# Patient Record
Sex: Male | Born: 1995 | Hispanic: No | Marital: Single | Smoking: Never smoker
Health system: Southern US, Community
[De-identification: ages and names within clinical notes are randomized; demographics above are authoritative.]

## PROBLEM LIST (undated history)

## (undated) ENCOUNTER — Emergency Department (HOSPITAL_BASED_OUTPATIENT_CLINIC_OR_DEPARTMENT_OTHER): Admission: EM | Payer: Medicaid Other | Source: Home / Self Care

## (undated) HISTORY — PX: FOOT SURGERY: SHX648

---
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
## 2018-12-25 ENCOUNTER — Ambulatory Visit (HOSPITAL_COMMUNITY)
Admission: EM | Admit: 2018-12-25 | Discharge: 2018-12-25 | Disposition: A | Discharge status: Home / Self Care | Payer: 59 | Attending: Family Medicine | Admitting: Family Medicine

## 2018-12-25 ENCOUNTER — Encounter (HOSPITAL_COMMUNITY): Payer: Self-pay

## 2018-12-25 ENCOUNTER — Other Ambulatory Visit: Payer: Self-pay

## 2018-12-25 DIAGNOSIS — S39012A Strain of muscle, fascia and tendon of lower back, initial encounter: Secondary | ICD-10-CM | POA: Diagnosis not present

## 2018-12-25 DIAGNOSIS — Y9366 Activity, soccer: Secondary | ICD-10-CM

## 2018-12-25 LAB — POCT URINALYSIS DIP (DEVICE)
Bilirubin Urine: NEGATIVE
Glucose, UA: NEGATIVE mg/dL
Hgb urine dipstick: NEGATIVE
Ketones, ur: NEGATIVE mg/dL
Leukocytes,Ua: NEGATIVE
Nitrite: NEGATIVE
Protein, ur: NEGATIVE mg/dL
Specific Gravity, Urine: >=1.03 (ref 1.005–1.030)
Urobilinogen, UA: 0.2 mg/dL (ref 0.0–1.0)
pH: 6.5 (ref 5.0–8.0)

## 2018-12-25 MED ORDER — CYCLOBENZAPRINE HCL 5 MG PO TABS: 5.0000 mg | ORAL_TABLET | Freq: Three times a day (TID) | ORAL | 0 refills | Status: AC | PRN

## 2018-12-25 MED ORDER — MELOXICAM 7.5 MG PO TABS: 7.5000 mg | ORAL_TABLET | Freq: Every day | ORAL | 0 refills | Status: DC

## 2018-12-25 NOTE — ED Provider Notes (Signed)
[facility]    CSN: [id number] Arrival date & time: 12/25/18  1557      History   Chief Complaint Chief Complaint  Patient presents with   Back Pain    HPI Jeffrey May is a 23 y.o. male.   Jeffrey May presents with complaints of left low back pain which started 11/1 while he was playing in a soccer game. He had jumped in the air to head the soccer ball, quickly rotating his head/neck. With that motion he felt pain to his left low back. He did not fall or land on his back, he landed back onto his feet. He has had pain since. Certain movements worsen the pain, especially bending. He does have some pain even with sitting, however. Pain is sharp. Has improved mildly but not much, pain 7/ 10. Doesn't radiate to buttocks or thighs. Has taken ibuprofen and aleve which have helped some. No leg weakness. No numbness or tingling. No saddle symptoms. No bladder or bowel incontinence. He also works for [employer] with frequent bending and lifting, he had to miss work today due to pain. He plays a lot of soccer with an upcoming tournament he hopes to play in. Has had some back pain in the past but feels like this is lasting longer than typical for him.     ROS per HPI, negative if not otherwise mentioned.      History reviewed. No pertinent past medical history.  There are no active problems to display for this patient.   History reviewed. No pertinent surgical history.     Home Medications    Prior to Admission medications   Medication Sig Start Date End Date Taking? Authorizing Provider  ibuprofen (MOTRIN IB) 200 MG tablet Take 2 tablets (400 mg total) by mouth every 6 (six) hours as needed for pain. 05/13/12  Yes Joelyn Oms, MD  cyclobenzaprine (FLEXERIL) 5 MG tablet Take 1 tablet (5 mg total) by mouth 3 (three) times daily as needed for muscle spasms. 12/25/18   Georgetta Haber, NP  meloxicam (MOBIC) 7.5 MG tablet Take 1 tablet (7.5 mg total) by  mouth daily. 12/25/18   Georgetta Haber, NP    Family History Family History  Problem Relation Age of Onset   Hypertension Mother    Healthy Father     Social History Social History   Tobacco Use   Smoking status: Never Smoker   Smokeless tobacco: Never Used  Substance Use Topics   Alcohol use: No   Drug use: No     Allergies   Patient has no known allergies.   Review of Systems Review of Systems   Physical Exam Triage Vital Signs ED Triage Vitals  Enc Vitals Group     BP 12/25/18 1635 129/88     Pulse Rate 12/25/18 1635 62     Resp 12/25/18 1635 17     Temp 12/25/18 1635 97.8 F (36.6 C)     Temp Source 12/25/18 1635 Oral     SpO2 12/25/18 1635 100 %     Weight --      Height --      Head Circumference --      Peak Flow --      Pain Score 12/25/18 1634 7     Pain Loc --      Pain Edu? --      Excl. in GC? --    No data found.  Updated Vital Signs BP 129/88 (BP  Location: Left Arm)    Pulse 62    Temp 97.8 F (36.6 C) (Oral)    Resp 17    SpO2 100%    Physical Exam Constitutional:      Appearance: He is well-developed.  Cardiovascular:     Rate and Rhythm: Normal rate.  Pulmonary:     Effort: Pulmonary effort is normal.  Musculoskeletal:     Lumbar back: He exhibits pain. He exhibits normal range of motion, no tenderness, no bony tenderness, no swelling, no edema, no deformity, no laceration, no spasm and normal pulse.       Back:     Comments: Indicates pain to left low back without pain on palpation; strength equal bilaterally; gross sensation intact to lower extremities; negative straight leg raise; ambulatory without difficulty; full ROm of back   Skin:    General: Skin is warm and dry.  Neurological:     Mental Status: He is alert and oriented to person, place, and time.      UC Treatments / Results  Labs (all labs ordered are listed, but only abnormal results are displayed) Labs Reviewed  POCT URINALYSIS DIP (DEVICE)     EKG   Radiology No results found.  Procedures Procedures (including critical care time)  Medications Ordered in UC Medications - No data to display  Initial Impression / Assessment and Plan / UC Course  I have reviewed the triage vital signs and the nursing notes.  Pertinent labs & imaging results that were available during my care of the patient were reviewed by me and considered in my medical decision making (see chart for details).     Low back strain without acute red flag findings. Pain management and prevention discussed. Patient plays a lot of soccer regularly, I do recommend following up with sports medicine for more long term management. Return precautions provided. Patient verbalized understanding and agreeable to plan.  Ambulatory out of clinic without difficulty.    Final Clinical Impressions(s) / UC Diagnoses   Final diagnoses:  Strain of lumbar region, initial encounter     Discharge Instructions     Light and regular activity as tolerated.  See exercises provided.  Heat application while active can help with muscle spasms.  Sleep with pillow under your knees.   Meloxicam daily. Don't take additional ibuprofen for aleve. Take with food.  Flexeril as needed, up to three times a day, this may be most helpful before bed. May cause drowsiness. Please do not take if driving or drinking alcohol.   Careful with lifting, and use appropriate techniques.  Please follow up with sports medicine for persistent or recurrent symptoms.    ED Prescriptions    Medication Sig Dispense Auth. Provider   meloxicam (MOBIC) 7.5 MG tablet Take 1 tablet (7.5 mg total) by mouth daily. 20 tablet Augusto Gamble B, NP   cyclobenzaprine (FLEXERIL) 5 MG tablet Take 1 tablet (5 mg total) by mouth 3 (three) times daily as needed for muscle spasms. 15 tablet Zigmund Gottron, NP     PDMP not reviewed this encounter.   Zigmund Gottron, NP 12/25/18 [phone]

## 2018-12-25 NOTE — ED Triage Notes (Signed)
Patient presents to Urgent Care with complaints of left lower back pain since 6 days ago during a soccer game. Patient reports "I was going up for a header and missed the ball and when I landed I felt a sharp pain on my left lower back and couldn't keep running afterwards".

## 2018-12-25 NOTE — Discharge Instructions (Signed)
Light and regular activity as tolerated.  See exercises provided.  Heat application while active can help with muscle spasms.  Sleep with pillow under your knees.   Meloxicam daily. Don't take additional ibuprofen for aleve. Take with food.  Flexeril as needed, up to three times a day, this may be most helpful before bed. May cause drowsiness. Please do not take if driving or drinking alcohol.   Careful with lifting, and use appropriate techniques.  Please follow up with sports medicine for persistent or recurrent symptoms.

## 2019-11-06 ENCOUNTER — Other Ambulatory Visit: Payer: Self-pay

## 2019-11-06 ENCOUNTER — Ambulatory Visit (HOSPITAL_COMMUNITY)
Admission: EM | Admit: 2019-11-06 | Discharge: 2019-11-06 | Disposition: A | Discharge status: Home / Self Care | Payer: 59 | Attending: Family Medicine | Admitting: Family Medicine

## 2019-11-06 ENCOUNTER — Encounter (HOSPITAL_COMMUNITY): Payer: Self-pay | Admitting: Emergency Medicine

## 2019-11-06 ENCOUNTER — Ambulatory Visit (INDEPENDENT_AMBULATORY_CARE_PROVIDER_SITE_OTHER): Payer: 59

## 2019-11-06 DIAGNOSIS — M25461 Effusion, right knee: Secondary | ICD-10-CM

## 2019-11-06 DIAGNOSIS — S8991XA Unspecified injury of right lower leg, initial encounter: Secondary | ICD-10-CM

## 2019-11-06 DIAGNOSIS — M25561 Pain in right knee: Secondary | ICD-10-CM | POA: Diagnosis not present

## 2019-11-06 MED ORDER — PREDNISONE 50 MG PO TABS
50.0000 mg | ORAL_TABLET | Freq: Every day | ORAL | 0 refills | Status: AC
Start: 2019-11-06 — End: 2019-11-11

## 2019-11-06 NOTE — Discharge Instructions (Addendum)
Your x-ray was negative for any fracture however was significant for right knee effusion which is fluid on the knee.  A continue wrapping your knee and application of ice at least twice daily.  Start prednisone as prescribed.  Try for at least 4 to 5 days if no significant improvement of pain follow-up with [name] directly across the street from our office.  I have included their contact information here on your discharge summary.

## 2019-11-06 NOTE — ED Provider Notes (Signed)
[facility]    CSN: [id number] Arrival date & time: 11/06/19  1457      History   Chief Complaint Chief Complaint  Patient presents with  . Knee Pain    APPT    HPI Jeffrey May is a 24 y.o. male.   HPI  Patient presents for evaluation of right knee pain following an injury while playing soccer 3 weeks ago. The injury was healing appropriately and he thinks he re injured it a few days ago. Pain with bearing weight on knee. No swelling, however with lots of walking, the knee pain increases. He thinks he twist the knee initially, however, recently felt a pop and pain lateral right knee while running. History reviewed. No pertinent past medical history.  There are no problems to display for this patient.   History reviewed. No pertinent surgical history.     Home Medications    Prior to Admission medications   Medication Sig Start Date End Date Taking? Authorizing Provider  cyclobenzaprine (FLEXERIL) 5 MG tablet Take 1 tablet (5 mg total) by mouth 3 (three) times daily as needed for muscle spasms. 12/25/18   Georgetta Haber, NP  ibuprofen (MOTRIN IB) 200 MG tablet Take 2 tablets (400 mg total) by mouth every 6 (six) hours as needed for pain. 05/13/12   Joelyn Oms, MD  meloxicam (MOBIC) 7.5 MG tablet Take 1 tablet (7.5 mg total) by mouth daily. 12/25/18   Georgetta Haber, NP    Family History Family History  Problem Relation Age of Onset  . Hypertension Mother   . Healthy Father     Social History Social History   Tobacco Use  . Smoking status: Never Smoker  . Smokeless tobacco: Never Used  Vaping Use  . Vaping Use: Never used  Substance Use Topics  . Alcohol use: No  . Drug use: No     Allergies   Patient has no known allergies.  Review of Systems Review of Systems Pertinent negatives listed in HPI  Physical Exam Triage Vital Signs ED Triage Vitals  Enc Vitals Group     BP 11/06/19 1601 106/90     Pulse Rate 11/06/19 1601 (!)  56     Resp 11/06/19 1601 18     Temp 11/06/19 1601 97.6 F (36.4 C)     Temp Source 11/06/19 1601 Oral     SpO2 11/06/19 1601 100 %     Weight --      Height --      Head Circumference --      Peak Flow --      Pain Score 11/06/19 1600 6     Pain Loc --      Pain Edu? --      Excl. in GC? --    No data found.  Updated Vital Signs BP 106/90 (BP Location: Left Arm)   Pulse (!) 56   Temp 97.6 F (36.4 C) (Oral)   Resp 18   SpO2 100%   Visual Acuity Right Eye Distance:   Left Eye Distance:   Bilateral Distance:    Right Eye Near:   Left Eye Near:    Bilateral Near:     Physical Exam Constitutional:      Appearance: Normal appearance.  Musculoskeletal:     Cervical back: Normal range of motion and neck supple.     Right knee: No swelling or bony tenderness. Decreased range of motion. No tenderness.     Left knee:  Normal. No swelling or bony tenderness. Normal range of motion. Normal meniscus and normal patellar mobility.  Skin:    General: Skin is warm and dry.  Neurological:     Mental Status: He is alert.      UC Treatments / Results  Labs (all labs ordered are listed, but only abnormal results are displayed) Labs Reviewed - No data to display  EKG   Radiology No results found.  Procedures Procedures (including critical care time)  Medications Ordered in UC Medications - No data to display  Initial Impression / Assessment and Plan / UC Course  I have reviewed the triage vital signs and the nursing notes.  Pertinent labs & imaging results that were available during my care of the patient were reviewed by me and considered in my medical decision making (see chart for details).      Treating for acute knee pain likely related to inflammation.  See medication discharge orders.  Encouraged RICE and wrap with an Ace wrap.  Trial of ice at least twice daily over the next 5 to 7 days.  Avoid any aggressive activity as this can reinjure her knee.  If no  improvement recommend follow-up at [facility] on [address]  Patient verbalized understanding and agreement with plan. Final Clinical Impressions(s) / UC Diagnoses   Final diagnoses:  Acute pain of right knee   Discharge Instructions   None    ED Prescriptions    None     PDMP not reviewed this encounter.   Bing Neighbors, [place] 11/10/19 [phone]

## 2019-11-06 NOTE — ED Triage Notes (Signed)
Pt presents with right knee pain. States injured in soccer around beginning of Aug. States pain is worst when running or walking.

## 2021-04-23 IMAGING — DX DG KNEE COMPLETE 4+V*R*
5 series · 5 of 5 positions shown · non-contrast
Comparison: None.

CLINICAL DATA: Right knee pain for 4 weeks after twisting soccer
injury

EXAM:
RIGHT KNEE - COMPLETE 4+ VIEW

[knee ap]
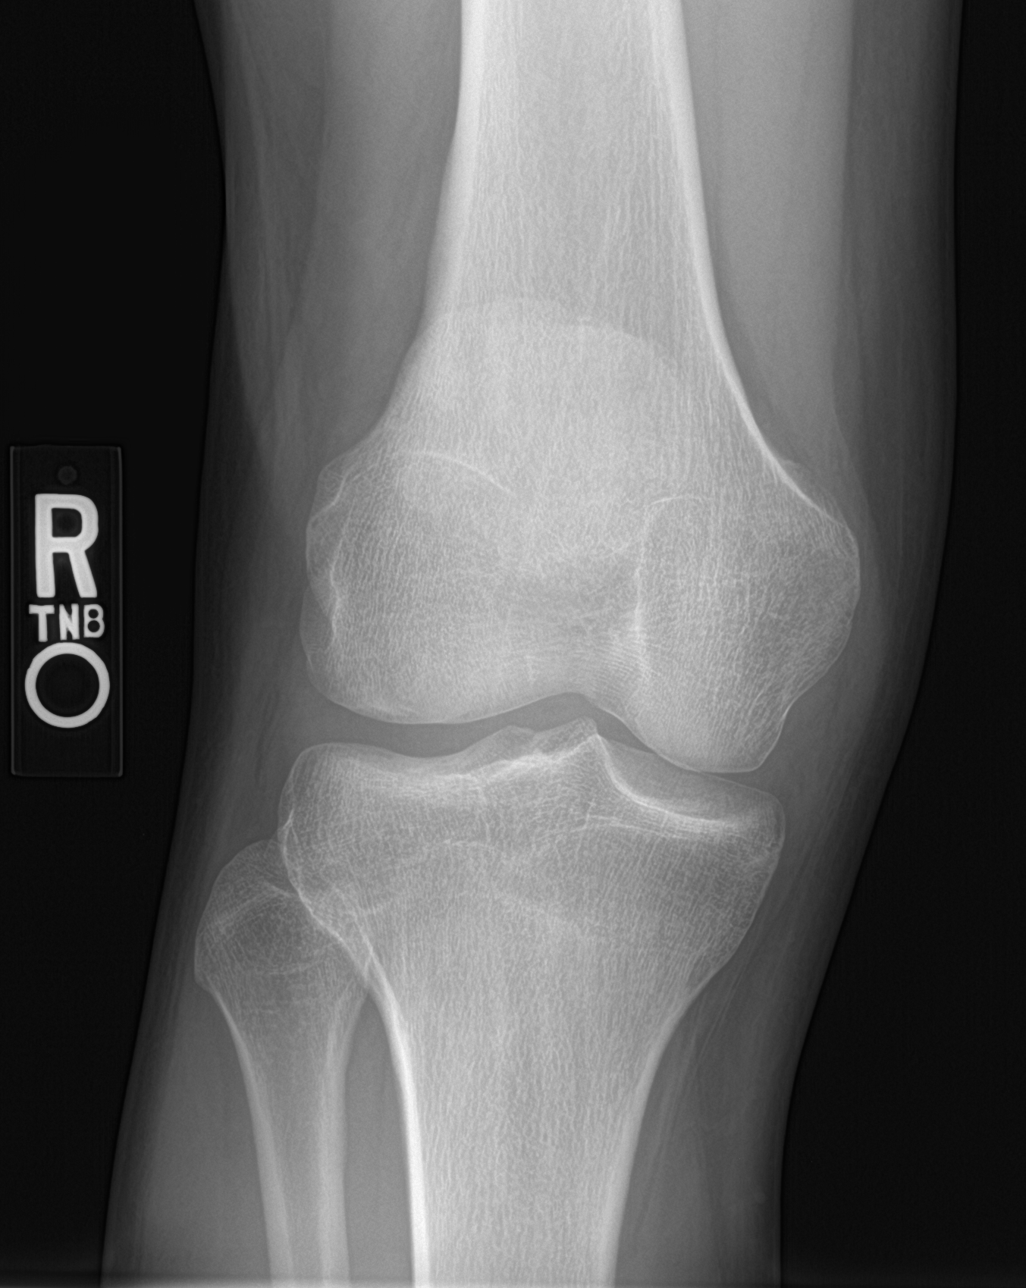

[knee obl (1 of 2)]
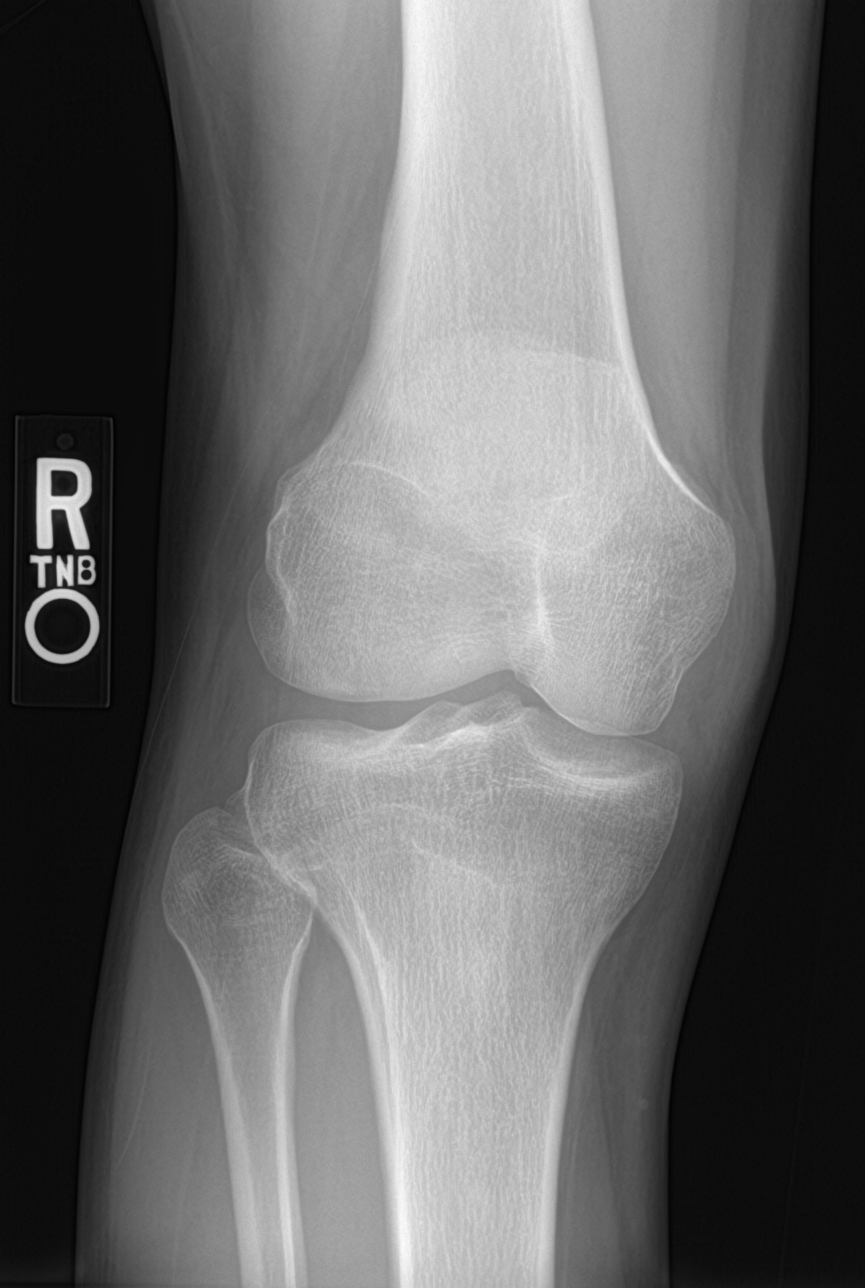

[knee obl (2 of 2)]
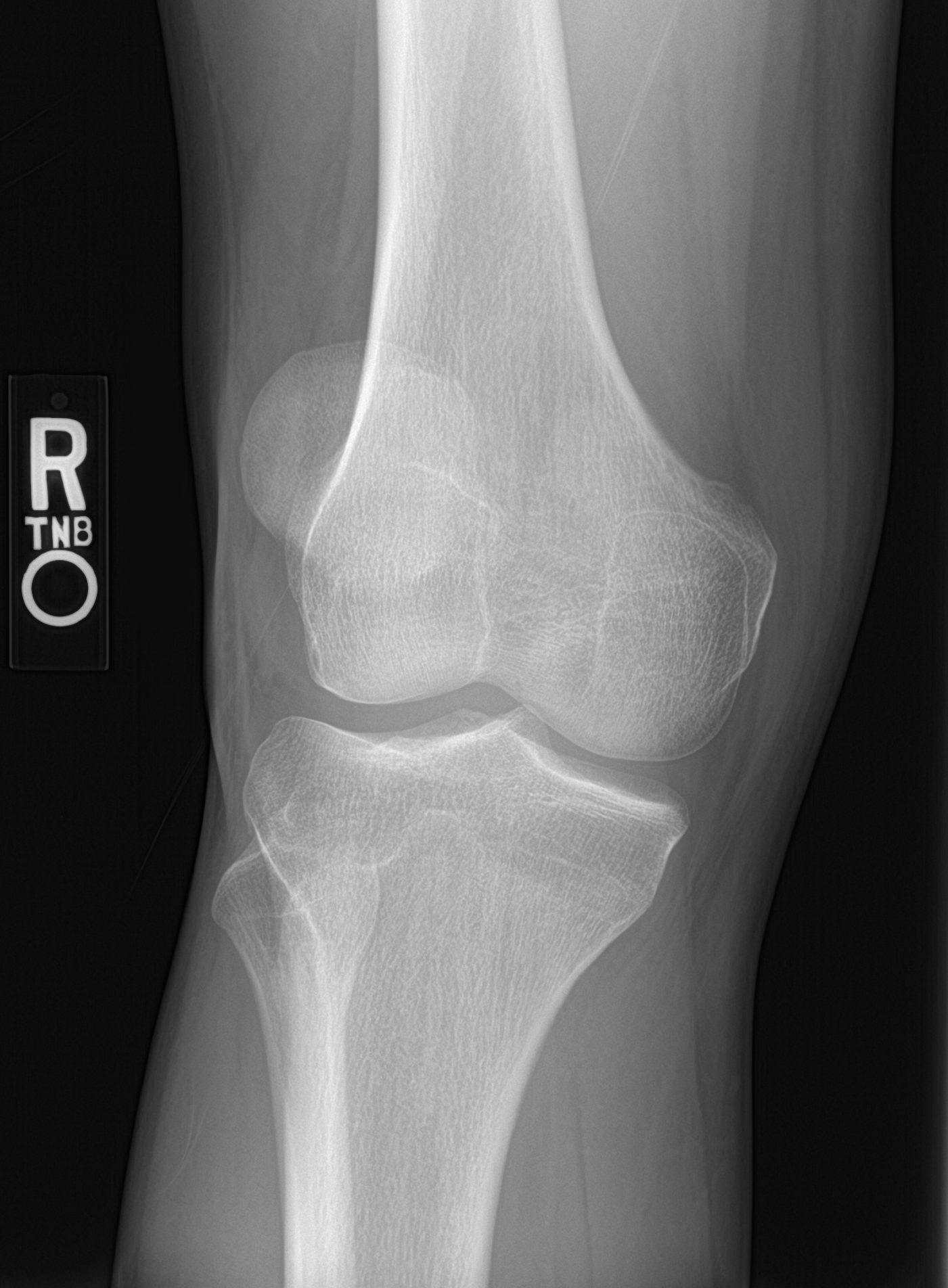

[knee lat]
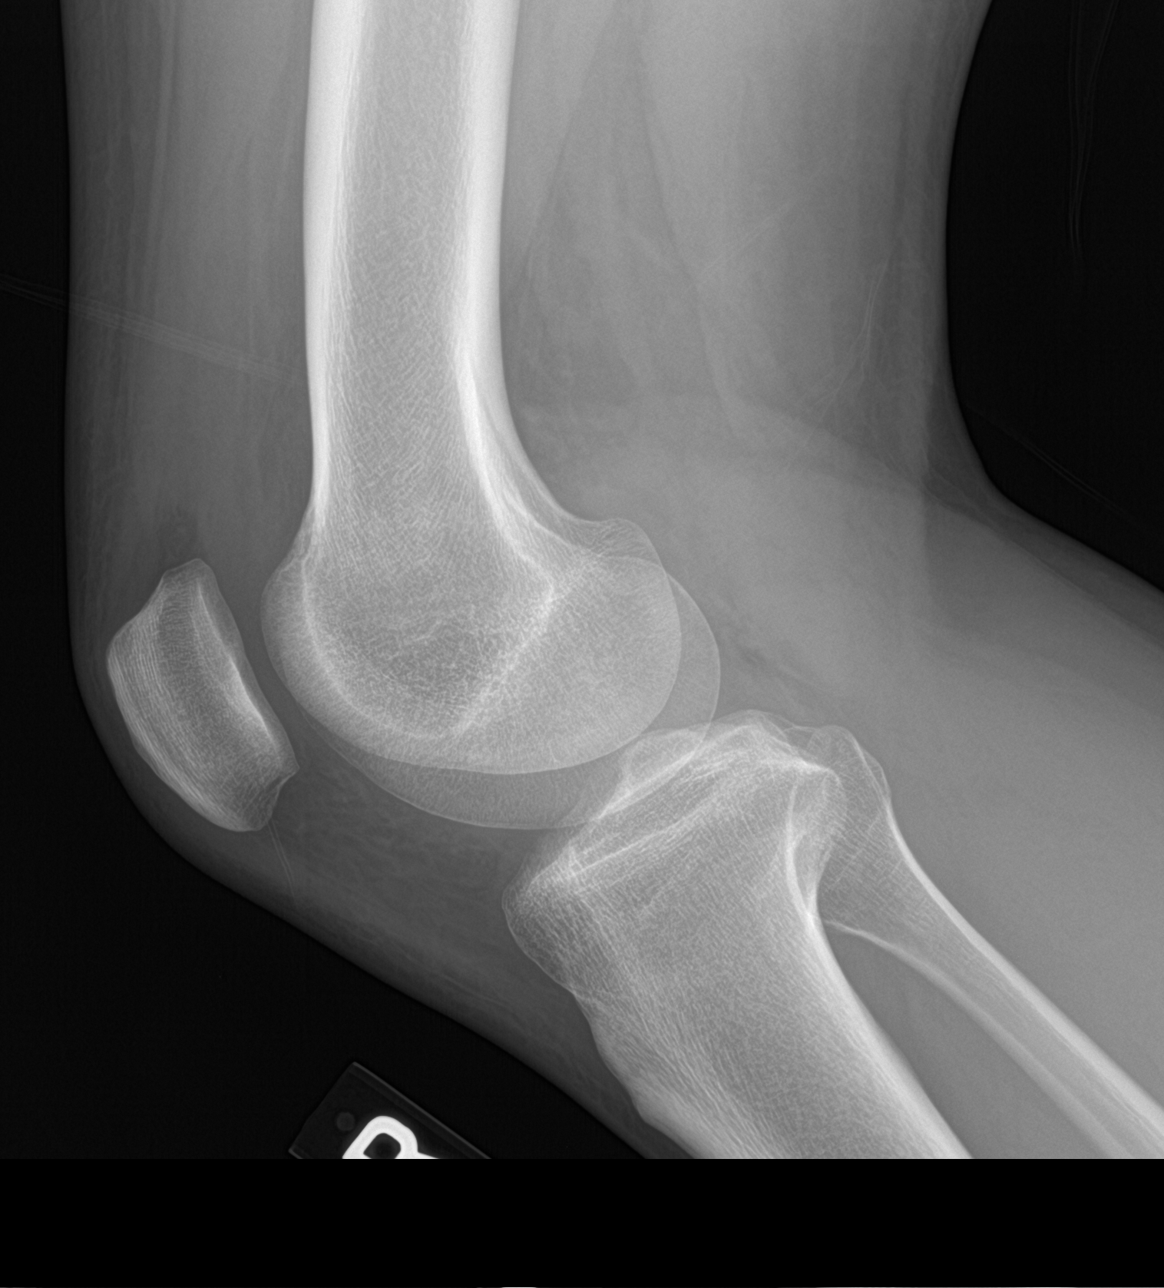

[knee sunrise]
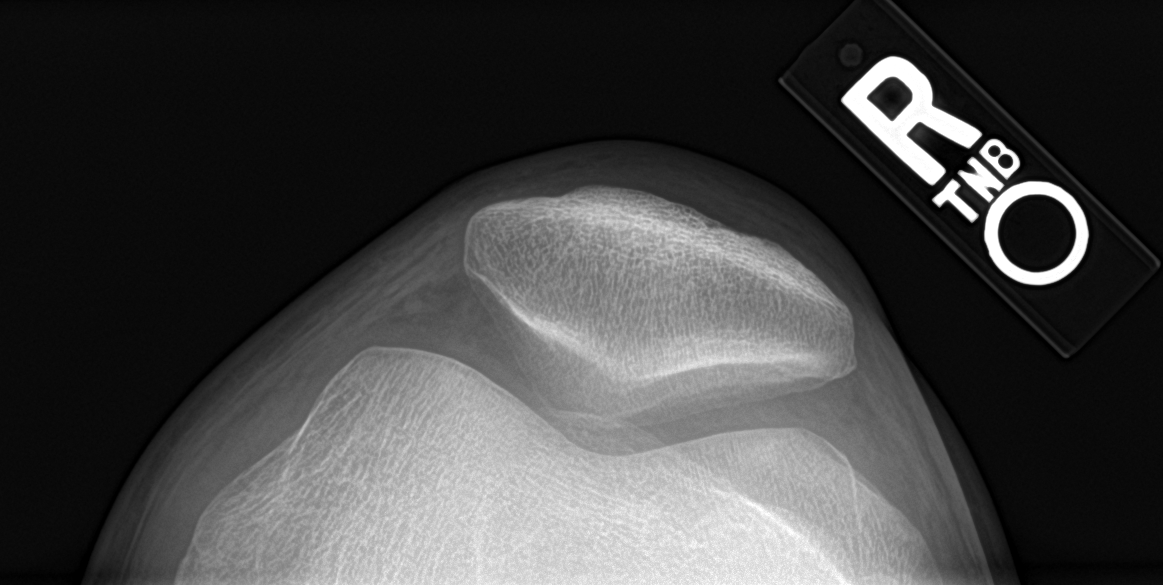

[5 of 5 positions shown; findings below may reference images not displayed]

FINDINGS: Small suprapatellar right knee joint effusion. No fracture or
dislocation. No focal osseous lesions. No significant degenerative
arthropathy. No radiopaque foreign bodies.
IMPRESSION: Small suprapatellar right knee joint effusion. No fracture or
malalignment.

## 2022-01-18 DIAGNOSIS — Z419 Encounter for procedure for purposes other than remedying health state, unspecified: Secondary | ICD-10-CM | POA: Diagnosis not present

## 2022-02-18 DIAGNOSIS — Z419 Encounter for procedure for purposes other than remedying health state, unspecified: Secondary | ICD-10-CM | POA: Diagnosis not present

## 2024-02-18 ENCOUNTER — Other Ambulatory Visit: Payer: Self-pay

## 2024-02-18 ENCOUNTER — Encounter (HOSPITAL_BASED_OUTPATIENT_CLINIC_OR_DEPARTMENT_OTHER): Payer: Self-pay | Admitting: Orthopaedic Surgery

## 2024-02-24 NOTE — H&P (Signed)
 "   PREOPERATIVE H&P  Chief Complaint: Oth spon disrupt of anterior cruciate ligament of right knee  HPI: Jeffrey May is a 29 y.o. male who is scheduled for, Procedures: KNEE ARTHROSCOPY WITH ANTERIOR CRUCIATE LIGAMENT (ACL) REPAIR ARTHROSCOPY, KNEE, WITH MEDIAL MENISCECTOMY ARTHROSCOPY, KNEE, WITH MENISCUS REPAIR.   A 29 year old male presents for evaluation of right knee pain and instability. 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 Recently, he has experienced increased instability and locking of the knee, particularly exacerbated by cold weather. The knee occasionally locks up, causing significant pain, which he manages by sitting down and manipulating the knee back into place. The patient is currently a consulting civil engineer and works at [employer], which may contribute to the knee's instability. He wears a brace for support. He is otherwise in good health. The patient is motivated to address the instability to improve his quality of life and ability to perform daily activities, including religious practices that require kneeling. He has not undergone any prior surgical interventions for this condition.   Symptoms are rated as moderate to severe, and have been worsening.  This is significantly impairing activities of daily living.    Please see clinic note for further details on this patient's care.    He has elected for surgical management.   History reviewed. No pertinent past medical history. History reviewed. No pertinent surgical history. Social History   Socioeconomic History   Marital status: Single    Spouse name: Not on file   Number of children: Not on file   Years of education: Not on file   Highest education level: Not on file  Occupational History   Not on file  Tobacco Use   Smoking status: Never   Smokeless tobacco: Never  Vaping Use   Vaping status: Never Used  Substance and Sexual Activity   Alcohol use: No   Drug  use: No   Sexual activity: Not on file  Other Topics Concern   Not on file  Social History Narrative   Not on file   Social Drivers of Health   Tobacco Use: Low Risk (02/18/2024)   Patient History    Smoking Tobacco Use: Never    Smokeless Tobacco Use: Never    Passive Exposure: Not on file  Financial Resource Strain: Not on file  Food Insecurity: Not on file  Transportation Needs: Not on file  Physical Activity: Not on file  Stress: Not on file  Social Connections: Not on file  Depression (EYV7-0): Not on file  Alcohol Screen: Not on file  Housing: Not on file  Utilities: Not on file  Health Literacy: Not on file   Family History  Problem Relation Age of Onset   Hypertension Mother    Healthy Father    Allergies[1] Prior to Admission medications  Medication Sig Start Date End Date Taking? Authorizing Provider  cyclobenzaprine  (FLEXERIL ) 5 MG tablet Take 1 tablet (5 mg total) by mouth 3 (three) times daily as needed for muscle spasms. 12/25/18   Burky, Natalie B, NP  ibuprofen  (MOTRIN  IB) 200 MG tablet Take 2 tablets (400 mg total) by mouth every 6 (six) hours as needed for pain. 05/13/12   Burton, Jalan, MD  meloxicam  (MOBIC ) 7.5 MG tablet Take 1 tablet (7.5 mg total) by mouth daily. 12/25/18   Burky, Natalie B, NP    ROS: All other systems have been reviewed and were otherwise negative with the exception of  those mentioned in the HPI and as above.  Physical Exam: General: Alert, no acute distress Cardiovascular: No pedal edema Respiratory: No cyanosis, no use of accessory musculature GI: No organomegaly, abdomen is soft and non-tender Skin: No lesions in the area of chief complaint Neurologic: Sensation intact distally Psychiatric: Patient is competent for consent with normal mood and affect Lymphatic: No axillary or cervical lymphadenopathy  MUSCULOSKELETAL:  Grossly unstable Lachman, tender palpation about the medial joint, positive McMurray's. Neurovascularly  intact distally.   Imaging: MRI of the right knee reviewed from our system from 01/21/2024 demonstrates a chronic ACL rupture. The medial meniscus has significant tearing, with a horizontal type tear, some posterior medial edema in the tibia as well.  Assessment: Oth spon disrupt of anterior cruciate ligament of right knee  Plan: Plan for Procedures: KNEE ARTHROSCOPY WITH ANTERIOR CRUCIATE LIGAMENT (ACL) REPAIR ARTHROSCOPY, KNEE, WITH MEDIAL MENISCECTOMY ARTHROSCOPY, KNEE, WITH MENISCUS REPAIR  The risks benefits and alternatives were discussed with the patient including but not limited to the risks of nonoperative treatment, versus surgical intervention including infection, bleeding, nerve injury,  blood clots, cardiopulmonary complications, morbidity, mortality, among others, and they were willing to proceed.   The patient acknowledged the explanation, agreed to proceed with the plan and consent was signed.   Operative Plan: RKS with ACL reconstruction with quad autograft and meniscectomy versus repair Discharge Medications: standard DVT Prophylaxis: aspirin Physical Therapy: outpatient Special Discharge needs: [place] (order). IceMan   Aleck LOISE Stalling, PA-C  02/24/2024 9:10 AM     [1] No Known Allergies  "

## 2024-02-24 NOTE — Discharge Instructions (Addendum)
 Bonner Hair MD, MPH Aleck Stalling, PA-C [facility] [address], [address] [phone] (tel)   [phone] (fax)   POST-OPERATIVE INSTRUCTIONS - ACL RECONSTRUCTION  WOUND CARE You may remove the Operative Dressing on Post-Op Day #3 (72hrs after surgery).   Leave steri strips in place.   If you feel more comfortable with it you can leave all dressings in place till your 1 week follow-up appointment.   KEEP THE INCISIONS CLEAN AND DRY. An ACE wrap may be used to control swelling, do not wrap this too tight.  If the initial ACE wrap feels too tight or constricting you may loosen it. There may be a small amount of fluid/bleeding leaking at the surgical site.  This is normal; the knee is filled with fluid during the procedure and can leak for 24-48hrs after surgery.  You may change/reinforce the bandage as needed.  Use the Cryocuff, GameReady or Ice as often as possible for the first 3-4 days, then as needed for pain relief. Always keep a towel, ACE wrap or other barrier between the cooling unit and your skin.  You may shower on Post-Op Day #3. Gently pat the area dry.  Do not soak the knee in water.  Do not go swimming in the pool or ocean until 4 weeks after surgery or when otherwise instructed.  BRACE/AMBULATION Your leg will be placed in a brace post-operatively.  You may remove for hygiene only! You will need to wear your brace at all times until we discuss it further.  It should be locked in full extension (0 degrees) if adjustable.   You will be instructed on further bracing after your first visit. Use crutches for comfort but you can put your full weight on the leg as tolerated.  PHYSICAL THERAPY - You will begin physical therapy soon after surgery (unless otherwise specified) - Please call to set up an appointment, if you do not already have one  - Let our office if there are any issues with scheduling your therapy   REGIONAL ANESTHESIA (NERVE  BLOCKS) The anesthesia team may have performed a nerve block for you this is a great tool used to minimize pain.   The block may start wearing off overnight (between 8-24 hours postop) When the block wears off, your pain may go from nearly zero to the pain you would have had postop without the block. This is an abrupt transition but nothing dangerous is happening.   This can be a challenging period but utilize your as needed pain medications to try and manage this period. We suggest you use the pain medication the first night prior to going to bed, to ease this transition.  You may take an extra dose of narcotic when this happens if needed  POST-OP MEDICATIONS- Multimodal approach to pain control In general your pain will be controlled with a combination of substances.  Prescriptions unless otherwise discussed are electronically sent to your pharmacy.  This is a carefully made plan we use to minimize narcotic use.     Meloxicam  - Anti-inflammatory medication taken on a scheduled basis Acetaminophen  - Non-narcotic pain medicine taken on a scheduled basis  Oxycodone  - This is a strong narcotic, to be used only on an as needed basis for SEVERE pain. Aspirin  81mg  - This medicine is used to minimize the risk of blood clots after surgery. Zofran  - take as needed for nausea   FOLLOW-UP Please call the office to schedule a follow-up appointment for your  incision check if you do not already have one, 7-10 days post-operatively. IF YOU HAVE ANY QUESTIONS, PLEASE FEEL FREE TO CALL OUR OFFICE.  HELPFUL INFORMATION   Keep your leg elevated to decrease swelling, which will then in turn decrease your pain. I would elevate the foot of your bed by putting a couple of couch pillows between your mattress and box spring. I would not keep pillow directly under your ankle.  You must wear the brace locked while sleeping and ambulating until follow-up.   There will be MORE swelling on days 1-3 than there is  on the day of surgery.  This also is normal. The swelling will decrease with the anti-inflammatory medication, ice and keeping it elevated. The swelling will make it more difficult to bend your knee. As the swelling goes down your motion will become easier  You may develop swelling and bruising that extends from your knee down to your calf and perhaps even to your foot over the next week. Do not be alarmed. This too is normal, and it is due to gravity  There may be some numbness adjacent to the incision site. This may last for 6-12 months or longer in some patients and is expected.  You may return to sedentary work/school in the next couple of days when you feel up to it. You will need to keep your leg elevated as much as possible   You should wean off your narcotic medicines as soon as you are able.  Most patients will be off narcotics before their first postop appointment.   We suggest you use the pain medication the first night prior to going to bed, in order to ease any pain when the anesthesia wears off. You should avoid taking pain medications on an empty stomach as it will make you nauseous.  Do not drink alcoholic beverages or take illicit drugs when taking pain medications.  It is against the law to drive while taking narcotics. You cannot drive if your Right leg is in brace locked in extension.  Pain medication may make you constipated.  Below are a few solutions to try in this order: Decrease the amount of pain medication if you arent having pain. Drink lots of decaffeinated fluids. Drink prune juice and/or each dried prunes  If the first 3 dont work start with additional solutions Take Colace - an over-the-counter stool softener Take Senokot - an over-the-counter laxative Take Miralax - a stronger over-the-counter laxative  For more information including helpful videos and documents visit our website:   [email or web address].[email or web address]-information.[email or web address]    Post  Anesthesia Home Care Instructions  Activity: Get plenty of rest for the remainder of the day. A responsible individual must stay with you for 24 hours following the procedure.  For the next 24 hours, DO NOT: -Drive a car -Advertising copywriter -Drink alcoholic beverages -Take any medication unless instructed by your physician -Make any legal decisions or sign important papers.  Meals: Start with liquid foods such as gelatin or soup. Progress to regular foods as tolerated. Avoid greasy, spicy, heavy foods. If nausea and/or vomiting occur, drink only clear liquids until the nausea and/or vomiting subsides. Call your physician if vomiting continues.  Special Instructions/Symptoms: Your throat may feel dry or sore from the anesthesia or the breathing tube placed in your throat during surgery. If this causes discomfort, gargle with warm salt water. The discomfort should disappear within 24 hours.  If you had a scopolamine patch placed behind your ear for the management of  post- operative nausea and/or vomiting:  1. The medication in the patch is effective for 72 hours, after which it should be removed.  Wrap patch in a tissue and discard in the trash. Wash hands thoroughly with soap and water. 2. You may remove the patch earlier than 72 hours if you experience unpleasant side effects which may include dry mouth, dizziness or visual disturbances. 3. Avoid touching the patch. Wash your hands with soap and water after contact with the patch.  Next dose of tylenol  may be taken at 12:30p        Regional Anesthesia Blocks  1. You may not be able to move or feel the blocked extremity after a regional anesthetic block. This may last may last from 3-48 hours after placement, but it will go away. The length of time depends on the medication injected and your individual response to the medication. As the nerves start to wake up, you may experience tingling as the movement and feeling returns to your  extremity. If the numbness and inability to move your extremity has not gone away after 48 hours, please call your surgeon.   2. The extremity that is blocked will need to be protected until the numbness is gone and the strength has returned. Because you cannot feel it, you will need to take extra care to avoid injury. Because it may be weak, you may have difficulty moving it or using it. You may not know what position it is in without looking at it while the block is in effect.  3. For blocks in the legs and feet, returning to weight bearing and walking needs to be done carefully. You will need to wait until the numbness is entirely gone and the strength has returned. You should be able to move your leg and foot normally before you try and bear weight or walk. You will need someone to be with you when you first try to ensure you do not fall and possibly risk injury.  4. Bruising and tenderness at the needle site are common side effects and will resolve in a few days.  5. Persistent numbness or new problems with movement should be communicated to the surgeon or the [facility] [phone] [facility] [phone]).

## 2024-02-25 NOTE — Anesthesia Preprocedure Evaluation (Addendum)
"                                    Anesthesia Evaluation  Patient identified by MRN, date of birth, ID band Patient awake    Reviewed: Allergy & Precautions, NPO status , Patient's Chart, lab work & pertinent test results  Airway Mallampati: II  TM Distance: >3 FB Neck ROM: Full    Dental  (+) Teeth Intact, Dental Advisory Given   Pulmonary neg pulmonary ROS   Pulmonary exam normal breath sounds clear to auscultation       Cardiovascular negative cardio ROS Normal cardiovascular exam Rhythm:Regular Rate:Normal     Neuro/Psych negative neurological ROS  negative psych ROS   GI/Hepatic negative GI ROS, Neg liver ROS,,,  Endo/Other  negative endocrine ROS    Renal/GU negative Renal ROS     Musculoskeletal disrupt of anterior cruciate ligament of right knee   Abdominal   Peds  Hematology negative hematology ROS (+)   Anesthesia Other Findings Day of surgery medications reviewed with the patient.  Reproductive/Obstetrics                              Anesthesia Physical Anesthesia Plan  ASA: 1  Anesthesia Plan: General   Post-op Pain Management: Regional block*, Tylenol  PO (pre-op)*, Toradol  IV (intra-op)* and Gabapentin  PO (pre-op)*   Induction: Intravenous  PONV Risk Score and Plan: 2 and Midazolam , Dexamethasone  and Ondansetron   Airway Management Planned: LMA  Additional Equipment:   Intra-op Plan:   Post-operative Plan: Extubation in OR  Informed Consent: I have reviewed the patients History and Physical, chart, labs and discussed the procedure including the risks, benefits and alternatives for the proposed anesthesia with the patient or authorized representative who has indicated his/her understanding and acceptance.     Dental advisory given  Plan Discussed with: CRNA  Anesthesia Plan Comments:          Anesthesia Quick Evaluation  "

## 2024-02-26 ENCOUNTER — Encounter (HOSPITAL_BASED_OUTPATIENT_CLINIC_OR_DEPARTMENT_OTHER)
Admission: RE | Disposition: A | Discharge status: Home / Self Care | Payer: Self-pay | Source: Home / Self Care | Attending: Orthopaedic Surgery

## 2024-02-26 ENCOUNTER — Ambulatory Visit (HOSPITAL_BASED_OUTPATIENT_CLINIC_OR_DEPARTMENT_OTHER): Payer: Self-pay | Admitting: Anesthesiology

## 2024-02-26 ENCOUNTER — Ambulatory Visit (HOSPITAL_BASED_OUTPATIENT_CLINIC_OR_DEPARTMENT_OTHER)
Admission: RE | Admit: 2024-02-26 | Discharge: 2024-02-26 | Disposition: A | Discharge status: Home / Self Care | Attending: Orthopaedic Surgery | Admitting: Orthopaedic Surgery

## 2024-02-26 ENCOUNTER — Other Ambulatory Visit: Payer: Self-pay

## 2024-02-26 ENCOUNTER — Ambulatory Visit (HOSPITAL_BASED_OUTPATIENT_CLINIC_OR_DEPARTMENT_OTHER)

## 2024-02-26 ENCOUNTER — Encounter (HOSPITAL_BASED_OUTPATIENT_CLINIC_OR_DEPARTMENT_OTHER): Payer: Self-pay | Admitting: Orthopaedic Surgery

## 2024-02-26 DIAGNOSIS — S83241A Other tear of medial meniscus, current injury, right knee, initial encounter: Secondary | ICD-10-CM | POA: Diagnosis not present

## 2024-02-26 DIAGNOSIS — S83281A Other tear of lateral meniscus, current injury, right knee, initial encounter: Secondary | ICD-10-CM | POA: Diagnosis not present

## 2024-02-26 DIAGNOSIS — M23611 Other spontaneous disruption of anterior cruciate ligament of right knee: Secondary | ICD-10-CM | POA: Diagnosis present

## 2024-02-26 DIAGNOSIS — S83511A Sprain of anterior cruciate ligament of right knee, initial encounter: Secondary | ICD-10-CM

## 2024-02-26 DIAGNOSIS — X58XXXA Exposure to other specified factors, initial encounter: Secondary | ICD-10-CM | POA: Insufficient documentation

## 2024-02-26 HISTORY — PX: KNEE ARTHROSCOPY WITH ANTERIOR CRUCIATE LIGAMENT (ACL) REPAIR: SHX5644

## 2024-02-26 HISTORY — PX: KNEE ARTHROSCOPY WITH MENISCAL REPAIR: SHX5653

## 2024-02-26 SURGERY — KNEE ARTHROSCOPY WITH ANTERIOR CRUCIATE LIGAMENT (ACL) REPAIR
Anesthesia: General | Site: Knee | Laterality: Right

## 2024-02-26 MED ORDER — SODIUM CHLORIDE 0.9 % IR SOLN
Status: DC | PRN
  Administered 2024-02-26: 6000 mL

## 2024-02-26 MED ORDER — DEXMEDETOMIDINE HCL IN NACL 80 MCG/20ML IV SOLN
INTRAVENOUS | Status: DC | PRN
  Administered 2024-02-26: 4 ug via INTRAVENOUS
  Administered 2024-02-26: 8 ug via INTRAVENOUS

## 2024-02-26 MED ORDER — ACETAMINOPHEN 500 MG PO TABS: 1000.0000 mg | ORAL_TABLET | Freq: Three times a day (TID) | ORAL | 0 refills | Status: AC

## 2024-02-26 MED ORDER — LIDOCAINE HCL (CARDIAC) PF 100 MG/5ML IV SOSY
PREFILLED_SYRINGE | INTRAVENOUS | Status: DC | PRN
  Administered 2024-02-26: 60 mg via INTRAVENOUS

## 2024-02-26 MED ORDER — BUPIVACAINE-EPINEPHRINE (PF) 0.5% -1:200000 IJ SOLN
INTRAMUSCULAR | Status: DC | PRN
  Administered 2024-02-26: 25 mL via PERINEURAL

## 2024-02-26 MED ORDER — VANCOMYCIN HCL 1000 MG IV SOLR
INTRAVENOUS | Status: DC | PRN
  Administered 2024-02-26: 1000 mg via TOPICAL

## 2024-02-26 MED ORDER — ONDANSETRON HCL 4 MG PO TABS: 4.0000 mg | ORAL_TABLET | Freq: Three times a day (TID) | ORAL | 0 refills | Status: AC | PRN

## 2024-02-26 MED ORDER — GABAPENTIN 300 MG PO CAPS
300.0000 mg | ORAL_CAPSULE | Freq: Once | ORAL | Status: AC
  Administered 2024-02-26: 300 mg via ORAL

## 2024-02-26 MED ORDER — MELOXICAM 15 MG PO TABS: 15.0000 mg | ORAL_TABLET | Freq: Every day | ORAL | 0 refills | Status: AC

## 2024-02-26 MED ORDER — FENTANYL CITRATE (PF) 100 MCG/2ML IJ SOLN
INTRAMUSCULAR | Status: AC
  Filled 2024-02-26: qty 2

## 2024-02-26 MED ORDER — PROPOFOL 10 MG/ML IV BOLUS
INTRAVENOUS | Status: DC | PRN
  Administered 2024-02-26: 200 mg via INTRAVENOUS

## 2024-02-26 MED ORDER — FENTANYL CITRATE (PF) 100 MCG/2ML IJ SOLN: 25.0000 ug | INTRAMUSCULAR | Status: DC | PRN

## 2024-02-26 MED ORDER — OXYCODONE HCL 5 MG PO TABS: ORAL_TABLET | ORAL | 0 refills | Status: AC

## 2024-02-26 MED ORDER — ONDANSETRON HCL 4 MG/2ML IJ SOLN: 4.0000 mg | Freq: Once | INTRAMUSCULAR | Status: DC | PRN

## 2024-02-26 MED ORDER — CEFAZOLIN SODIUM-DEXTROSE 2-4 GM/100ML-% IV SOLN
2.0000 g | INTRAVENOUS | Status: AC
  Administered 2024-02-26: 2 g via INTRAVENOUS

## 2024-02-26 MED ORDER — CLONIDINE HCL (ANALGESIA) 100 MCG/ML EP SOLN
EPIDURAL | Status: DC | PRN
  Administered 2024-02-26: 50 ug

## 2024-02-26 MED ORDER — DEXAMETHASONE SODIUM PHOSPHATE 4 MG/ML IJ SOLN
INTRAMUSCULAR | Status: DC | PRN
  Administered 2024-02-26: 8 mg via INTRAVENOUS

## 2024-02-26 MED ORDER — CEFAZOLIN SODIUM-DEXTROSE 2-4 GM/100ML-% IV SOLN
INTRAVENOUS | Status: AC
  Filled 2024-02-26: qty 100

## 2024-02-26 MED ORDER — ONDANSETRON HCL 4 MG/2ML IJ SOLN
INTRAMUSCULAR | Status: DC | PRN
  Administered 2024-02-26: 4 mg via INTRAVENOUS

## 2024-02-26 MED ORDER — MIDAZOLAM HCL 2 MG/2ML IJ SOLN
INTRAMUSCULAR | Status: AC
  Filled 2024-02-26: qty 2

## 2024-02-26 MED ORDER — AMISULPRIDE (ANTIEMETIC) 5 MG/2ML IV SOLN: 10.0000 mg | Freq: Once | INTRAVENOUS | Status: DC | PRN

## 2024-02-26 MED ORDER — LACTATED RINGERS IV SOLN: INTRAVENOUS | Status: DC

## 2024-02-26 MED ORDER — DEXMEDETOMIDINE HCL IN NACL 80 MCG/20ML IV SOLN
INTRAVENOUS | Status: AC
  Filled 2024-02-26: qty 40

## 2024-02-26 MED ORDER — ACETAMINOPHEN 500 MG PO TABS
ORAL_TABLET | ORAL | Status: AC
  Filled 2024-02-26: qty 2

## 2024-02-26 MED ORDER — KETOROLAC TROMETHAMINE 30 MG/ML IJ SOLN
INTRAMUSCULAR | Status: DC | PRN
  Administered 2024-02-26: 30 mg via INTRAVENOUS

## 2024-02-26 MED ORDER — MIDAZOLAM HCL (PF) 2 MG/2ML IJ SOLN
2.0000 mg | Freq: Once | INTRAMUSCULAR | Status: AC
  Administered 2024-02-26: 2 mg via INTRAVENOUS

## 2024-02-26 MED ORDER — ASPIRIN 81 MG PO CHEW: 81.0000 mg | CHEWABLE_TABLET | Freq: Two times a day (BID) | ORAL | 0 refills | Status: AC

## 2024-02-26 MED ORDER — GABAPENTIN 300 MG PO CAPS
ORAL_CAPSULE | ORAL | Status: AC
  Filled 2024-02-26: qty 1

## 2024-02-26 MED ORDER — OXYCODONE HCL 5 MG PO TABS
5.0000 mg | ORAL_TABLET | Freq: Once | ORAL | Status: AC
  Administered 2024-02-26: 5 mg via ORAL

## 2024-02-26 MED ORDER — FENTANYL CITRATE (PF) 100 MCG/2ML IJ SOLN
100.0000 ug | Freq: Once | INTRAMUSCULAR | Status: AC
  Administered 2024-02-26: 100 ug via INTRAVENOUS

## 2024-02-26 MED ORDER — ACETAMINOPHEN 500 MG PO TABS
1000.0000 mg | ORAL_TABLET | Freq: Once | ORAL | Status: AC
  Administered 2024-02-26: 1000 mg via ORAL

## 2024-02-26 MED ORDER — OXYCODONE HCL 5 MG PO TABS
ORAL_TABLET | ORAL | Status: AC
  Filled 2024-02-26: qty 1

## 2024-02-26 MED ORDER — FENTANYL CITRATE (PF) 100 MCG/2ML IJ SOLN
INTRAMUSCULAR | Status: DC | PRN
  Administered 2024-02-26: 25 ug via INTRAVENOUS
  Administered 2024-02-26 (×2): 50 ug via INTRAVENOUS

## 2024-02-26 SURGICAL SUPPLY — 67 items
BLADE SHAVER BONE 5.0X13 (MISCELLANEOUS) ×1 IMPLANT
BLADE SURG 10 STRL SS (BLADE) ×1 IMPLANT
BLADE SURG 15 STRL LF DISP TIS (BLADE) ×1 IMPLANT
BNDG COHESIVE 4X5 TAN STRL LF (GAUZE/BANDAGES/DRESSINGS) IMPLANT
BNDG COMPR ESMARK 6X3 LF (GAUZE/BANDAGES/DRESSINGS) IMPLANT
BNDG ELASTIC 6INX 5YD STR LF (GAUZE/BANDAGES/DRESSINGS) ×1 IMPLANT
BONE TUNNEL PLUG CANNULATED (MISCELLANEOUS) IMPLANT
BURR OVAL 8 FLU 4.0X13 (MISCELLANEOUS) IMPLANT
CHLORAPREP W/TINT 26 (MISCELLANEOUS) ×1 IMPLANT
CLSR STERI-STRIP ANTIMIC 1/2X4 (GAUZE/BANDAGES/DRESSINGS) ×1 IMPLANT
COOLER ICEMAN CLASSIC (MISCELLANEOUS) ×1 IMPLANT
COVER BACK TABLE 60X90IN (DRAPES) ×1 IMPLANT
CUFF TRNQT CYL 34X4.125X (TOURNIQUET CUFF) ×1 IMPLANT
CUTTER KNOT PUSHER W/ SKID (INSTRUMENTS) IMPLANT
DISSECTOR 3.5MM X 13CM CVD (MISCELLANEOUS) IMPLANT
DISSECTOR 4.0MMX13CM CVD (MISCELLANEOUS) ×1 IMPLANT
DRAPE IMP U-DRAPE 54X76 (DRAPES) IMPLANT
DRAPE OEC MINIVIEW 54X84 (DRAPES) ×1 IMPLANT
DRAPE U-SHAPE 47X51 STRL (DRAPES) ×1 IMPLANT
DRAPE-T ARTHROSCOPY W/POUCH (DRAPES) ×1 IMPLANT
ELECTRODE REM PT RTRN 9FT ADLT (ELECTROSURGICAL) ×1 IMPLANT
FIBERSTICK 2 (SUTURE) IMPLANT
FILTER NEPTUNE SMOKE EVACUATOR (MISCELLANEOUS) IMPLANT
GAUZE SPONGE 4X4 12PLY STRL (GAUZE/BANDAGES/DRESSINGS) ×2 IMPLANT
GLOVE BIO SURGEON STRL SZ 6.5 (GLOVE) ×1 IMPLANT
GLOVE BIOGEL PI IND STRL 6.5 (GLOVE) ×1 IMPLANT
GLOVE BIOGEL PI IND STRL 8 (GLOVE) ×1 IMPLANT
GLOVE ECLIPSE 8.0 STRL XLNG CF (GLOVE) ×2 IMPLANT
GOWN STRL REUS W/ TWL LRG LVL3 (GOWN DISPOSABLE) ×2 IMPLANT
GOWN STRL REUS W/TWL XL LVL3 (GOWN DISPOSABLE) ×1 IMPLANT
IMMOBILIZER KNEE 20 THIGH 36 (SOFTGOODS) IMPLANT
IMMOBILIZER KNEE 22 UNIV (SOFTGOODS) IMPLANT
IMPL FIBERSTITCH 1.5 CVD (Anchor) IMPLANT
IMPL QUADLINK SYSTEM 10 (Orthopedic Implant) IMPLANT
KIT TRANSTIBIAL (DISPOSABLE) ×1 IMPLANT
KIT TURNOVER KIT B (KITS) ×1 IMPLANT
MANIFOLD NEPTUNE II (INSTRUMENTS) IMPLANT
NDL SAFETY ECLIP 18X1.5 (MISCELLANEOUS) ×1 IMPLANT
NEEDLE SUT 2-0 SCORPION KNEE (NEEDLE) IMPLANT
PACK ARTHROSCOPY DSU (CUSTOM PROCEDURE TRAY) ×1 IMPLANT
PACK BASIN DAY SURGERY FS (CUSTOM PROCEDURE TRAY) IMPLANT
PAD CAST 4YDX4 CTTN HI CHSV (CAST SUPPLIES) ×1 IMPLANT
PAD COLD SHLDR WRAP-ON (PAD) ×1 IMPLANT
PENCIL SMOKE EVACUATOR (MISCELLANEOUS) ×1 IMPLANT
PIN DRILL ACL TIGHTROPE 4MM (PIN) IMPLANT
SCREW FT BIOCOMP 10X30 (Screw) IMPLANT
SHEET MEDIUM DRAPE 40X70 STRL (DRAPES) ×1 IMPLANT
SLEEVE SCD COMPRESS KNEE MED (STOCKING) ×1 IMPLANT
SOL .9 NS 3000ML IRR UROMATIC (IV SOLUTION) ×4 IMPLANT
SOLN 0.9% NACL POUR BTL 1000ML (IV SOLUTION) ×1 IMPLANT
SOLN STERILE WATER BTL 1000 ML (IV SOLUTION) ×1 IMPLANT
SPIKE FLUID TRANSFER (MISCELLANEOUS) IMPLANT
SPONGE T-LAP 4X18 ~~LOC~~+RFID (SPONGE) ×1 IMPLANT
SUT MNCRL AB 4-0 PS2 18 (SUTURE) ×1 IMPLANT
SUT PDS AB 0 CT 36 (SUTURE) IMPLANT
SUT VIC AB 0 CT1 27XBRD ANBCTR (SUTURE) ×2 IMPLANT
SUT VIC AB 1 CT1 27XBRD ANBCTR (SUTURE) IMPLANT
SUT VIC AB 3-0 SH 27X BRD (SUTURE) ×1 IMPLANT
SUTURE 0 FIBERLP 38 BLU TPR ND (SUTURE) IMPLANT
SUTURE FIBERWR #2 38 T-5 BLUE (SUTURE) IMPLANT
SUTURE TAPE 1.3 FIBERLOP 20 ST (SUTURE) IMPLANT
TISSUE GRAFT COLLECTOR (SYSTAGENIX WOUND MANAGEMENT) IMPLANT
TOWEL GREEN STERILE FF (TOWEL DISPOSABLE) ×2 IMPLANT
TUBE CONNECTING 20X1/4 (TUBING) ×1 IMPLANT
TUBE SUCTION HIGH CAP CLEAR NV (SUCTIONS) ×1 IMPLANT
TUBING ARTHROSCOPY IRRIG 16FT (MISCELLANEOUS) ×1 IMPLANT
WAND ABLATOR APOLLO I90 (BUR) ×1 IMPLANT

## 2024-02-26 NOTE — Transfer of Care (Signed)
 Immediate Anesthesia Transfer of Care Note  Patient: Jeffrey May  Procedure(s) Performed: KNEE ARTHROSCOPY WITH ANTERIOR CRUCIATE LIGAMENT (ACL) REPAIR (Right: Knee) ARTHROSCOPY, KNEE, WITH MENISCUS REPAIR (Right: Knee)  Patient Location: PACU  Anesthesia Type:General and Regional  Level of Consciousness: drowsy and patient cooperative  Airway & Oxygen Therapy: Patient Spontanous Breathing and Patient connected to nasal cannula oxygen  Post-op Assessment: Report given to RN and Post -op Vital signs reviewed and stable  Post vital signs: Reviewed and stable  Last Vitals:  Vitals Value Taken Time  BP 123/68 02/26/24 09:48  Temp    Pulse 73 02/26/24 09:50  Resp 19 02/26/24 09:50  SpO2 100 % 02/26/24 09:50  Vitals shown include unfiled device data.  Last Pain:  Vitals:   02/26/24 9367  TempSrc: Temporal  PainSc: 0-No pain      Patients Stated Pain Goal: 3 (02/26/24 9367)  Complications: No notable events documented.

## 2024-02-26 NOTE — Interval H&P Note (Signed)
 All questions answered, patient wants to proceed with procedure. ? ?

## 2024-02-26 NOTE — Anesthesia Procedure Notes (Signed)
 Procedure Name: LMA Insertion Date/Time: 02/26/2024 8:26 AM  Performed by: Donnell Berwyn SQUIBB, CRNAPre-anesthesia Checklist: Patient identified, Emergency Drugs available, Suction available, Patient being monitored and Timeout performed Patient Re-evaluated:Patient Re-evaluated prior to induction Oxygen Delivery Method: Circle system utilized Preoxygenation: Pre-oxygenation with 100% oxygen Induction Type: IV induction Ventilation: Mask ventilation without difficulty LMA: LMA inserted LMA Size: 5.0 Number of attempts: 1 Placement Confirmation: positive ETCO2 and breath sounds checked- equal and bilateral Tube secured with: Tape Dental Injury: Teeth and Oropharynx as per pre-operative assessment

## 2024-02-26 NOTE — Anesthesia Postprocedure Evaluation (Signed)
"   Anesthesia Post Note  Patient: [name]  Procedure(s) Performed: KNEE ARTHROSCOPY WITH ANTERIOR CRUCIATE LIGAMENT (ACL) REPAIR (Right: Knee) ARTHROSCOPY, KNEE, WITH MENISCUS REPAIR (Right: Knee)     Patient location during evaluation: PACU Anesthesia Type: General Level of consciousness: awake and alert Pain management: pain level controlled Vital Signs Assessment: post-procedure vital signs reviewed and stable Respiratory status: spontaneous breathing, nonlabored ventilation and respiratory function stable Cardiovascular status: blood pressure returned to baseline and stable Postop Assessment: no apparent nausea or vomiting Anesthetic complications: no   No notable events documented.  Last Vitals:  Vitals:   02/26/24 1030 02/26/24 1045  BP: (!) 137/94 136/77  Pulse: 78 74  Resp: 19 18  Temp:  36.7 C  SpO2: 97% 98%    Last Pain:  Vitals:   02/26/24 1057  TempSrc:   PainSc: 4                  Garnette FORBES Skillern      "

## 2024-02-26 NOTE — Anesthesia Procedure Notes (Signed)
 Anesthesia Regional Block: Adductor canal block   Pre-Anesthetic Checklist: , timeout performed,  Correct Patient, Correct Site, Correct Laterality,  Correct Procedure, Correct Position, site marked,  Risks and benefits discussed,  Surgical consent,  Pre-op evaluation,  At surgeon's request and post-op pain management  Laterality: Right  Prep: chloraprep       Needles:  Injection technique: Single-shot  Needle Type: Echogenic Needle     Needle Length: 9cm  Needle Gauge: 21     Additional Needles:   Procedures:,,,, ultrasound used (permanent image in chart),,    Narrative:  Start time: 02/26/2024 7:08 AM End time: 02/26/2024 7:15 AM Injection made incrementally with aspirations every 5 mL.  Performed by: Personally  Anesthesiologist: Corinne Garnette BRAVO, MD  Additional Notes: No pain on injection. No increased resistance to injection. Injection made in 5cc increments.  Good needle visualization.  Patient tolerated procedure well.

## 2024-02-26 NOTE — Progress Notes (Signed)
AssistedDr. Desmond Lope with right, adductor canal, ultrasound guided block. Side rails up, monitors on throughout procedure. See vital signs in flow sheet. Tolerated Procedure well. ? ?

## 2024-02-26 NOTE — Op Note (Signed)
 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  Unfortunately the patient lost most of his medial meniscus with this bucket-handle tear in the setting of a chronic ACL.  He has a high risk of medial degeneration  Lateral Compartment: Vertical tear of the posterior lateral meniscus repaired with a fiber stitch anchors Intercondylar Notch: ACL rupture  Successful completion of the planned procedure.  Patient is a high risk of degeneration of the medial compartment secondary to his meniscal deficiency, he may be a candidate for meniscal transplant the future  Post-operative plan: The patient will be weightbearing as tolerated with a hinged brace.  The patient will be discharged home.  DVT prophylaxis Aspirin  81 mg twice daily for 6 weeks.  Pain control with PRN pain medication preferring oral medicines.  Follow up plan will be scheduled in approximately 7 days for incision check and XR.  Post-Op Diagnosis: Same Surgeons:Primary: Cristy Bonner DASEN, MD Assistants:Caroline McBane, PA-C Location: [facility] OR ROOM 1 Anesthesia: General with adductor Antibiotics: Ancef  2 g with local vancomycin  powder 1 g at the surgical site Tourniquet time: 45 Estimated Blood Loss: Minimal Complications: None Specimens: None Implants: Implant Name Type Inv. Item Serial No. Manufacturer Lot No. LRB No. Used Action  IMPL FIBERSTITCH  1.5 CVD - [id number] Anchor IMPL FIBERSTITCH 1.5 CVD  [employer] [id number] Right 1 Implanted  SCREW FT BIOCOMP 10X30 - [id number] Screw SCREW FT BIOCOMP 10X30  [employer] [id number] Right 1 Implanted  IMPL QUADLINK SYSTEM 10 - [id number] Orthopedic Implant IMPL QUADLINK SYSTEM 10  [employer] [id number] Right 1 Implanted    Indications for Surgery:   Jeffrey May is a 29 y.o. male with ACL rupture.  Benefits and risks of operative and nonoperative management were discussed prior to surgery with patient/guardian(s) and informed consent form was completed.  Specific risks including infection, need for additional surgery, retear, post meniscectomy syndrome, stiffness amongst others   Procedure:   The patient was identified properly. Informed consent was obtained and the surgical site was marked. The patient was taken up to suite where general anesthesia was induced. The patient was placed in the supine position with a post against the surgical leg and a nonsterile tourniquet applied. The surgical leg was then prepped and draped usual sterile fashion.  A standard surgical timeout was performed.  2 standard anterior portals were made and diagnostic arthroscopy performed. Please note the findings as noted above.  We began with our quadriceps autograft harvest.  A longitudinal incision was made off the superior pole of the patella.  Went through skin sharply achieving hemostasis we progressed.  Identified the fat pad overlying the distal insertion of the quadriceps tendon.  We resected an area of fat pad to clearly expose the quadriceps tendon and used a Metzenbaum scissor to elevate a full-thickness layer just off the anterior portion of the quadriceps tendon.  We are able to visualize the quadriceps clearly at this point.  We then measured a 9 mm graft centered over the quadriceps tendon taking care to note that we  get good length.  We freed the 9 mm graft off the patella using a knife sharply.  We then able  to whipstitch this area using a FiberWire and use a [employer] quad pro harvester to perform a minimally invasive quad harvest obtaining 82 mm of total length.  We did not violate the capsule.  This was taken to the back table and when stitched formed a 9 mm graft.  We fixed a tight rope to one end of the graft and whipstitched the alternating end.  Once this repaired it was placed on stretch and left on the back table protected.  We began arthroscopy and made our lateral and medial portals in the typical fashion. Fat pad was resected and diagnostic arthroscopy performed with the findings listed above.   Medial meniscus with bucket-handle tear that was unable to be repaired.  We trimmed back to a stable base.  50 to 60% total medial meniscal volume was resected.  Unfortunately it was unable to be repaired.  Lateral meniscus had a vertical tear.  It was stimulated and then a single vertical mattress fiber stitch anchor was placed.  Good fixation was noted.  The anterior cruciate ligament stump was debrided utilizing a shaver taking great care to preserve the remnant stump on the femur and the tibia for localization of our tunnels. Once the remnant anterior cruciate ligament was removed and we obtained appropriate visualization by performing a small notchplasty and confirmed that we had indeed identify the over-the-top position. We made small marks at the location of the aperture of the tibial and femoral tunnels and double checked our location prior to drilling.  Once we identified the appropriate position of the femoral aperture of the ACL we hyperflexed the knee and used a 7 mm offset guide through an anterior medial portal to drill a pin.  We then reamed a 9 mm tunnel by 30 mm in length.  We overreamed from the lateral side with a 4 mm reamer to allow passage of our button.  We shuttled a passing suture.  We then turned our attention to the tibial side.  We cleared the tissue from the tibial aperture  using the anterior horn of the lateral meniscus as a guide.  We were able to then use a freehand technique to place a 2 4 pin in the appropriate anatomic position.  We reamed a complete tunnel.  We shuttled our suture for eventual passage.  We began passing the graft through the tibial tunnel in an antegrade fashion.  Femoral fixation was with a tight rope device and we checked its placement on the lateral periosteum with fluoroscopy.  We verified arthroscopically that there is no sign of graft impingement on the notch. We then cycled the knee multiple times and turned our attention to the tibia.  Tibia was fixed with a 9x 30 mm bio composite Ar[employer]rex screw.  There was good purchase of the screw and the screw protrusion thus we felt there is no need to back up this fixation.  At this point a gentle Lachman maneuver was performed and there is a stable endpoint and no translation.   Incisions closed with absorbable suture. The patient was awoken from general anesthesia and taken to the PACU in stable condition without complication.   Aleck Stalling, PA-C, present and scrubbed throughout the case, critical for completion in a timely fashion, and for retraction, instrumentation, closure.

## 2024-02-27 ENCOUNTER — Encounter (HOSPITAL_BASED_OUTPATIENT_CLINIC_OR_DEPARTMENT_OTHER): Payer: Self-pay | Admitting: Orthopaedic Surgery
